# Patient Record
Sex: Male | Born: 1985
Health system: Southern US, Community
[De-identification: ages and names within clinical notes are randomized; demographics above are authoritative.]

---
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
## 2005-05-20 ENCOUNTER — Ambulatory Visit (HOSPITAL_COMMUNITY): Admission: RE | Admit: 2005-05-20 | Discharge: 2005-05-20 | Payer: Self-pay | Admitting: Orthopaedic Surgery

## 2006-04-04 ENCOUNTER — Emergency Department (HOSPITAL_COMMUNITY): Admission: EM | Admit: 2006-04-04 | Discharge: 2006-04-05 | Payer: Self-pay | Admitting: Emergency Medicine

## 2017-04-05 DIAGNOSIS — T753XXA Motion sickness, initial encounter: Secondary | ICD-10-CM | POA: Diagnosis not present

## 2017-04-11 MED FILL — TRANSDERM-SCOP 1.5 MG/3 DAY: 1 | 18 days supply | Qty: 6 | Fill #0

## 2018-04-03 DIAGNOSIS — K591 Functional diarrhea: Secondary | ICD-10-CM | POA: Diagnosis not present

## 2018-04-03 DIAGNOSIS — R1110 Vomiting, unspecified: Secondary | ICD-10-CM | POA: Diagnosis not present

## 2018-04-06 DIAGNOSIS — Z0000 Encounter for general adult medical examination without abnormal findings: Secondary | ICD-10-CM | POA: Diagnosis not present

## 2018-06-06 ENCOUNTER — Other Ambulatory Visit (HOSPITAL_COMMUNITY): Payer: Self-pay | Admitting: Family Medicine

## 2018-06-06 ENCOUNTER — Ambulatory Visit (HOSPITAL_COMMUNITY)
Admission: RE | Admit: 2018-06-06 | Discharge: 2018-06-06 | Disposition: A | Discharge status: Home / Self Care | Payer: 59 | Source: Ambulatory Visit | Attending: Family Medicine | Admitting: Family Medicine

## 2018-06-06 DIAGNOSIS — R918 Other nonspecific abnormal finding of lung field: Secondary | ICD-10-CM | POA: Diagnosis not present

## 2018-06-06 DIAGNOSIS — J0100 Acute maxillary sinusitis, unspecified: Secondary | ICD-10-CM | POA: Diagnosis not present

## 2018-06-06 DIAGNOSIS — R0781 Pleurodynia: Secondary | ICD-10-CM | POA: Insufficient documentation

## 2018-06-06 MED FILL — DOXYCYCLINE HYCLATE 100 MG: 100 | 14 days supply | Qty: 28 | Fill #0

## 2018-06-06 MED FILL — predniSONE 10 MG TABS: 10 | 12 days supply | Qty: 42 | Fill #0

## 2018-06-26 DIAGNOSIS — L739 Follicular disorder, unspecified: Secondary | ICD-10-CM | POA: Diagnosis not present

## 2018-10-02 DIAGNOSIS — J069 Acute upper respiratory infection, unspecified: Secondary | ICD-10-CM | POA: Diagnosis not present

## 2018-10-02 DIAGNOSIS — J029 Acute pharyngitis, unspecified: Secondary | ICD-10-CM | POA: Diagnosis not present

## 2019-05-28 DIAGNOSIS — R430 Anosmia: Secondary | ICD-10-CM | POA: Diagnosis not present

## 2019-05-28 DIAGNOSIS — J309 Allergic rhinitis, unspecified: Secondary | ICD-10-CM | POA: Diagnosis not present

## 2019-05-28 DIAGNOSIS — R197 Diarrhea, unspecified: Secondary | ICD-10-CM | POA: Diagnosis not present

## 2019-05-28 DIAGNOSIS — R05 Cough: Secondary | ICD-10-CM | POA: Diagnosis not present

## 2019-05-28 DIAGNOSIS — Z20828 Contact with and (suspected) exposure to other viral communicable diseases: Secondary | ICD-10-CM | POA: Diagnosis not present

## 2019-06-26 DIAGNOSIS — M25531 Pain in right wrist: Secondary | ICD-10-CM | POA: Diagnosis not present

## 2019-07-06 ENCOUNTER — Other Ambulatory Visit: Payer: Self-pay

## 2019-07-06 ENCOUNTER — Ambulatory Visit (INDEPENDENT_AMBULATORY_CARE_PROVIDER_SITE_OTHER): Payer: 59 | Admitting: Orthopaedic Surgery

## 2019-07-06 ENCOUNTER — Ambulatory Visit (INDEPENDENT_AMBULATORY_CARE_PROVIDER_SITE_OTHER): Payer: 59

## 2019-07-06 DIAGNOSIS — M25531 Pain in right wrist: Secondary | ICD-10-CM

## 2019-07-06 NOTE — Progress Notes (Signed)
Office Visit Note   Patient: Howard James           Date of Birth: 1986-08-10           MRN: [id number] Visit Date: 07/06/2019              Requested by: No referring provider defined for this encounter. PCP: Enid Skeens., MD   Assessment & Plan: Visit Diagnoses:  1. Pain in right wrist     Plan: Impression is right wrist pain questionable occult SL ligament injury.  I feel that his symptoms are more severe than a typical ganglion cyst therefore we will need to obtain an MR arthrogram of the right wrist to fully evaluate for structural abnormalities.    Follow-Up Instructions: Return in about 2 weeks (around 07/20/2019) for for MRI wrist review.   Orders:  Orders Placed This Encounter  Procedures  . XR Wrist Complete Right   No orders of the defined types were placed in this encounter.     Procedures: No procedures performed   Clinical Data: No additional findings.   Subjective: Chief Complaint  Patient presents with  . Right Wrist - Pain    Howard James is a very pleasant 33 year old right-hand-dominant healthy gentleman who comes in with for evaluation of right wrist pain for the last 6 to 12 months with a possible occult injury.  He had a left dorsal wrist ganglion cyst removed when he was 18.  He states that in the last 2 months his right wrist has gotten more symptomatic especially when he is active and using his wrist.  Wrist extension causes significant pain.  He feels like there may be a cyst in his right wrist.  Denies any numbness and tingling.   Review of Systems  Constitutional: Negative.   All other systems reviewed and are negative.    Objective: Vital Signs: There were no vitals taken for this visit.  Physical Exam Vitals signs and nursing note reviewed.  Constitutional:      Appearance: He is well-developed.  HENT:     Head: Normocephalic and atraumatic.  Eyes:     Pupils: Pupils are equal, round, and reactive to light.  Neck:   Musculoskeletal: Neck supple.  Pulmonary:     Effort: Pulmonary effort is normal.  Abdominal:     Palpations: Abdomen is soft.  Musculoskeletal: Normal range of motion.  Skin:    General: Skin is warm.  Neurological:     Mental Status: He is alert and oriented to person, place, and time.  Psychiatric:        Behavior: Behavior normal.        Thought Content: Thought content normal.        Judgment: Judgment normal.     Ortho Exam Right wrist exam shows significant tenderness in the SL interval.  TFCC is nontender.  Radial styloid is nontender. Specialty Comments:  No specialty comments available.  Imaging: Xr Wrist Complete Right  Result Date: 07/06/2019 No acute or structural abnormalities    PMFS History: There are no active problems to display for this patient.  No past medical history on file.  No family history on file.   Social History   Occupational History  . Not on file  Tobacco Use  . Smoking status: Not on file  Substance and Sexual Activity  . Alcohol use: Not on file  . Drug use: Not on file  . Sexual activity: Not on file

## 2019-07-20 ENCOUNTER — Ambulatory Visit: Payer: 59 | Admitting: Orthopaedic Surgery

## 2019-07-24 DIAGNOSIS — R21 Rash and other nonspecific skin eruption: Secondary | ICD-10-CM | POA: Diagnosis not present

## 2019-10-22 IMAGING — CR DG CHEST 2V
2 series · 2 of 2 positions shown · non-contrast
Comparison: None.

CLINICAL DATA: Recent sinus infection. Patient reports pleuritic
chest discomfort with the breathing which is becoming more frequent.

EXAM:
CHEST - 2 VIEW

[w chest pa]
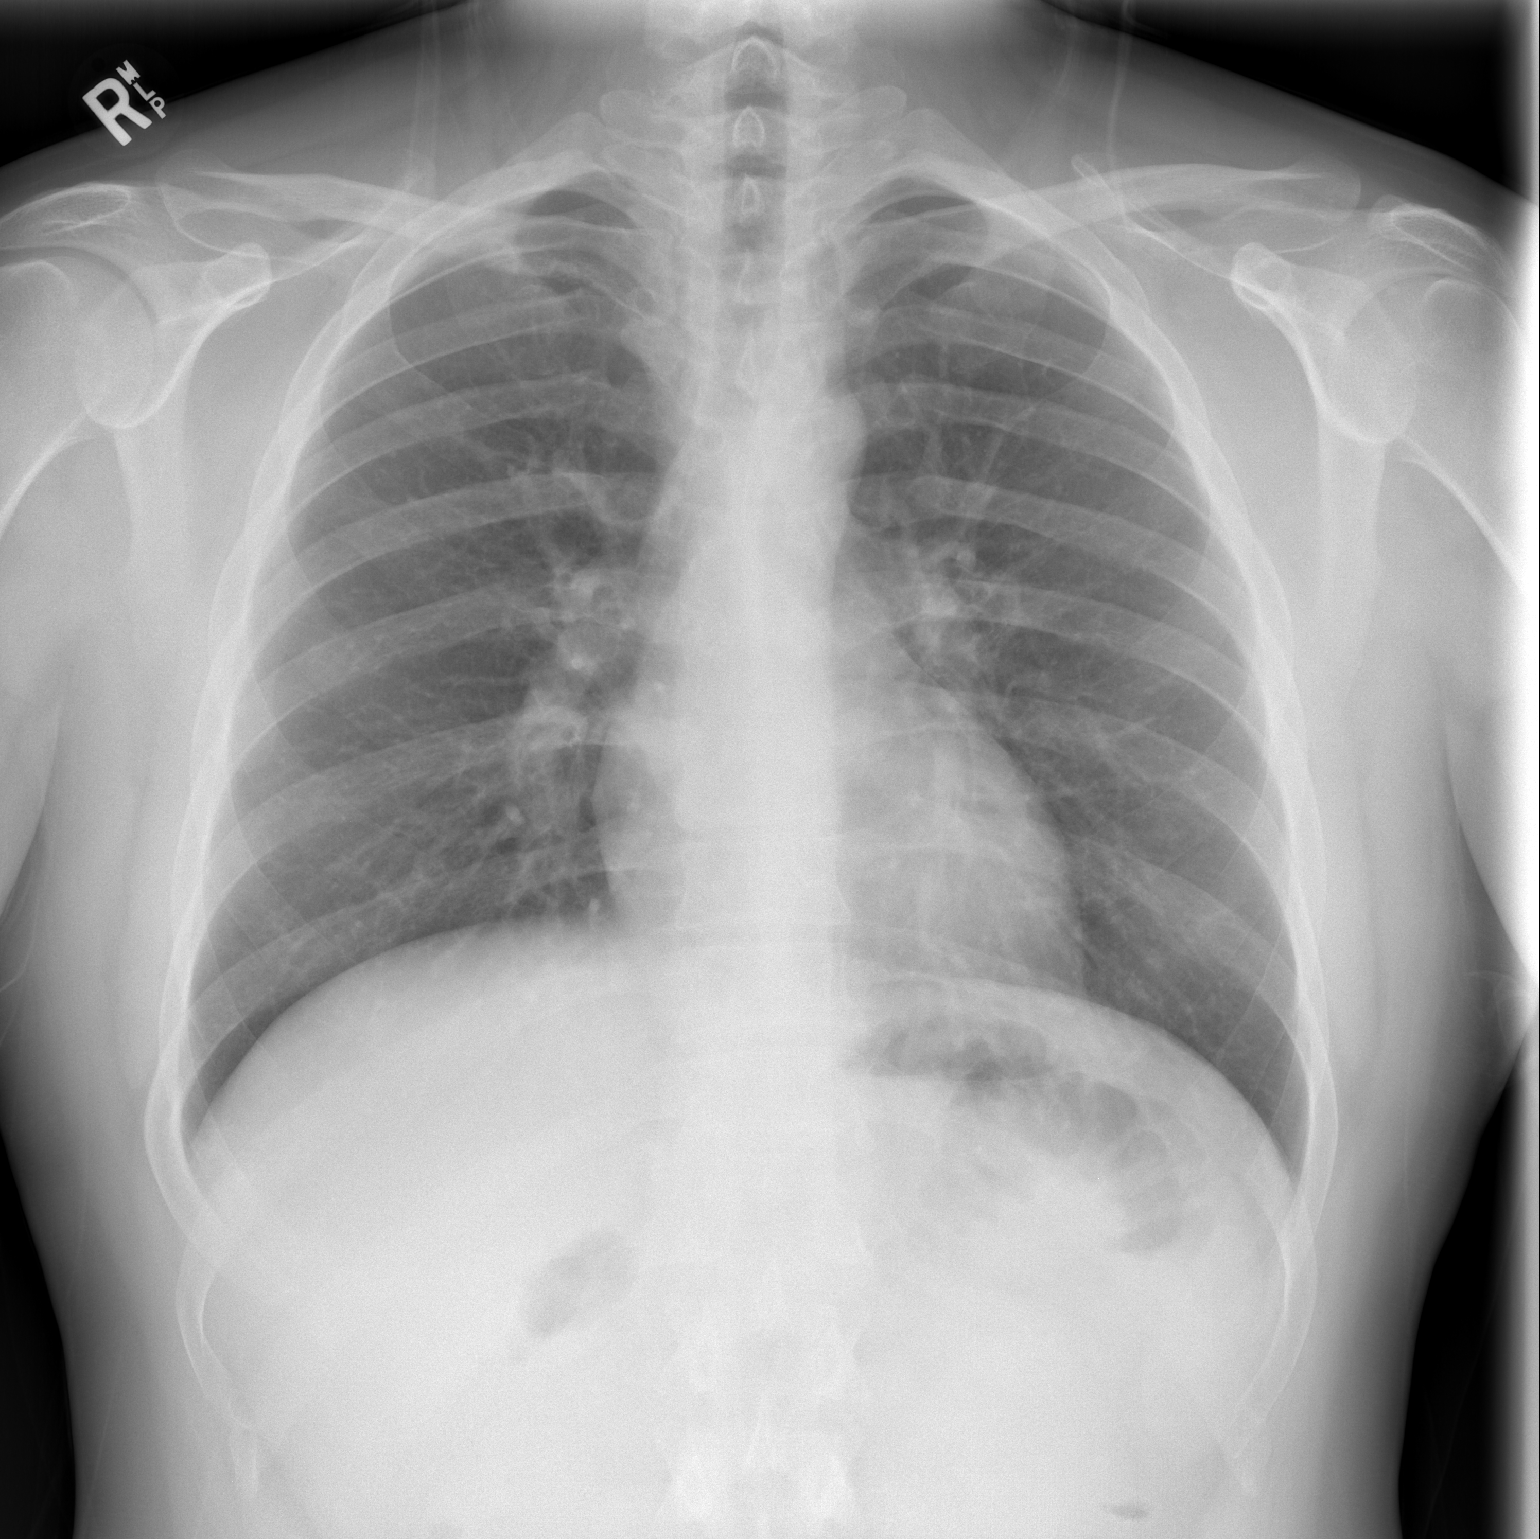

[w chest lat]
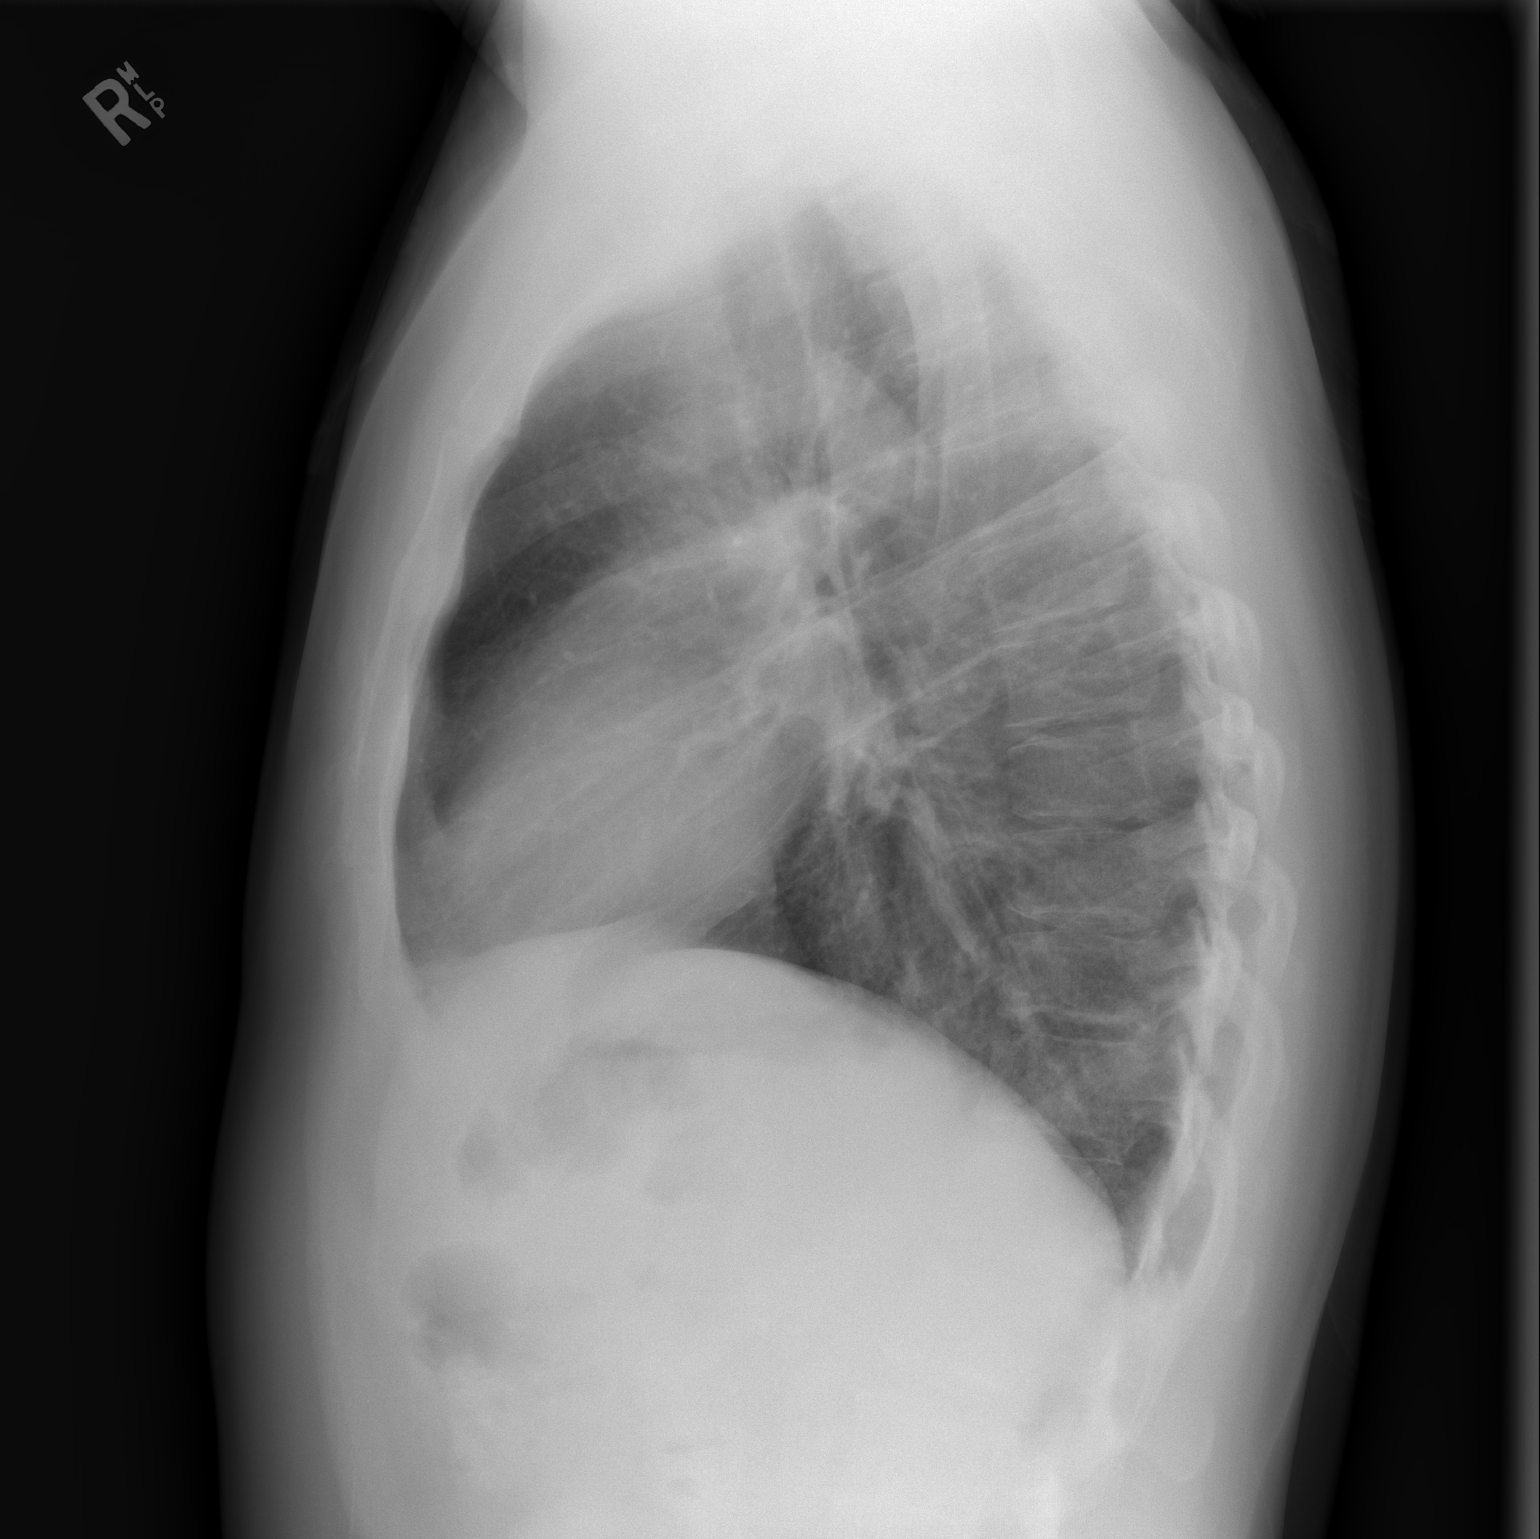

[2 of 2 positions shown; findings below may reference images not displayed]

FINDINGS: The lungs are adequately inflated. The lung markings are coarse in
the left lower lobe posteriorly. There is no pleural effusion or
pneumothorax. The heart and pulmonary vascularity are normal. The
mediastinum is normal in width. The bony thorax exhibits no acute
abnormality.
IMPRESSION: Left lower lobe subsegmental atelectasis or developing infiltrate.
Follow-up radiographs following anticipated antibiotic therapy are
recommended if the patient's symptoms persist.

## 2020-12-26 DIAGNOSIS — Z23 Encounter for immunization: Secondary | ICD-10-CM | POA: Diagnosis not present

## 2021-06-04 ENCOUNTER — Other Ambulatory Visit (HOSPITAL_COMMUNITY): Payer: Self-pay

## 2021-06-04 DIAGNOSIS — W57XXXA Bitten or stung by nonvenomous insect and other nonvenomous arthropods, initial encounter: Secondary | ICD-10-CM | POA: Diagnosis not present

## 2021-06-04 DIAGNOSIS — L301 Dyshidrosis [pompholyx]: Secondary | ICD-10-CM | POA: Diagnosis not present

## 2021-06-04 DIAGNOSIS — S80861A Insect bite (nonvenomous), right lower leg, initial encounter: Secondary | ICD-10-CM | POA: Diagnosis not present

## 2021-06-04 MED ORDER — CLOBETASOL PROPIONATE 0.05 % EX OINT
TOPICAL_OINTMENT | CUTANEOUS | 0 refills | Status: AC
  Filled 2021-06-04: qty 15, 3d supply, fill #0
  Filled 2021-06-04: qty 30, 7d supply, fill #0

## 2021-06-18 DIAGNOSIS — L814 Other melanin hyperpigmentation: Secondary | ICD-10-CM | POA: Diagnosis not present

## 2021-06-18 DIAGNOSIS — D225 Melanocytic nevi of trunk: Secondary | ICD-10-CM | POA: Diagnosis not present

## 2021-06-18 DIAGNOSIS — D1801 Hemangioma of skin and subcutaneous tissue: Secondary | ICD-10-CM | POA: Diagnosis not present

## 2021-06-18 DIAGNOSIS — D2271 Melanocytic nevi of right lower limb, including hip: Secondary | ICD-10-CM | POA: Diagnosis not present

## 2021-06-18 DIAGNOSIS — L578 Other skin changes due to chronic exposure to nonionizing radiation: Secondary | ICD-10-CM | POA: Diagnosis not present

## 2021-07-02 ENCOUNTER — Other Ambulatory Visit (HOSPITAL_COMMUNITY): Payer: Self-pay

## 2021-07-10 ENCOUNTER — Other Ambulatory Visit (HOSPITAL_COMMUNITY): Payer: Self-pay

## 2021-08-05 DIAGNOSIS — Z8279 Family history of other congenital malformations, deformations and chromosomal abnormalities: Secondary | ICD-10-CM | POA: Diagnosis not present

## 2021-09-02 DIAGNOSIS — J189 Pneumonia, unspecified organism: Secondary | ICD-10-CM | POA: Diagnosis not present

## 2021-09-02 DIAGNOSIS — Z20828 Contact with and (suspected) exposure to other viral communicable diseases: Secondary | ICD-10-CM | POA: Diagnosis not present

## 2021-09-02 DIAGNOSIS — R062 Wheezing: Secondary | ICD-10-CM | POA: Diagnosis not present

## 2022-01-15 ENCOUNTER — Telehealth: Payer: No Typology Code available for payment source | Admitting: Physician Assistant

## 2022-01-15 DIAGNOSIS — H1030 Unspecified acute conjunctivitis, unspecified eye: Secondary | ICD-10-CM | POA: Diagnosis not present

## 2022-01-15 MED ORDER — OFLOXACIN 0.3 % OP SOLN
1.0000 [drp] | Freq: Four times a day (QID) | OPHTHALMIC | 0 refills | Status: AC
  Filled 2022-01-15: qty 5, 25d supply, fill #0
  Filled 2022-01-19: qty 5, 10d supply, fill #0

## 2022-01-15 NOTE — Progress Notes (Signed)
E-Visit for Pink Eye ? ? ?We are sorry that you are not feeling well.  Here is how we plan to help! ? ?Based on what you have shared with me it looks like you have conjunctivitis.  Conjunctivitis is a common inflammatory or infectious condition of the eye that is often referred to as "pink eye".  In most cases it is contagious (viral or bacterial). However, not all conjunctivitis requires antibiotics (ex. Allergic).  We have made appropriate suggestions for you based upon your presentation. ? ?I have prescribed Oflaxacin 1-2 drops 4 times a day times 5 days  ? ?If you are having vision changes you need to be seen for an in person visit. ? ?Pink eye can be highly contagious.  It is typically spread through direct contact with secretions, or contaminated objects or surfaces that one may have touched.  Strict handwashing is suggested with soap and water is urged.  If not available, use alcohol based had sanitizer.  Avoid unnecessary touching of the eye.  If you wear contact lenses, you will need to refrain from wearing them until you see no white discharge from the eye for at least 24 hours after being on medication.  You should see symptom improvement in 1-2 days after starting the medication regimen.  Call us if symptoms are not improved in 1-2 days. ? ?Home Care: ?Wash your hands often! ?Do not wear your contacts until you complete your treatment plan. ?Avoid sharing towels, bed linen, personal items with a person who has pink eye. ?See attention for anyone in your home with similar symptoms. ? ?Get Help Right Away If: ?Your symptoms do not improve. ?You develop blurred or loss of vision. ?Your symptoms worsen (increased discharge, pain or redness) ? ? ?Thank you for choosing an e-visit. ? ?Your e-visit answers were reviewed by a board certified advanced clinical practitioner to complete your personal care plan. Depending upon the condition, your plan could have included both over the counter or prescription  medications. ? ?Please review your pharmacy choice. Make sure the pharmacy is open so you can pick up prescription now. If there is a problem, you may contact your provider through [employer] and have the prescription routed to another pharmacy.  Your safety is important to [place]. If you have drug allergies check your prescription carefully.  ? ?For the next 24 hours you can use MyChart to ask questions about today's visit, request a non-urgent call back, or ask for a work or school excuse. ?You will get an email in the next two days asking about your experience. I hope that your e-visit has been valuable and will speed your recovery. ? ?Approximately 5 minutes was spent documenting and reviewing patient's chart. ? ?

## 2022-01-16 ENCOUNTER — Other Ambulatory Visit (HOSPITAL_COMMUNITY): Payer: Self-pay

## 2022-01-16 ENCOUNTER — Encounter (HOSPITAL_COMMUNITY): Payer: Self-pay

## 2022-01-19 ENCOUNTER — Telehealth: Payer: No Typology Code available for payment source | Admitting: Physician Assistant

## 2022-01-19 ENCOUNTER — Other Ambulatory Visit (HOSPITAL_COMMUNITY): Payer: Self-pay

## 2022-01-19 DIAGNOSIS — J069 Acute upper respiratory infection, unspecified: Secondary | ICD-10-CM

## 2022-01-19 MED ORDER — IPRATROPIUM BROMIDE 0.03 % NA SOLN
2.0000 | Freq: Two times a day (BID) | NASAL | 0 refills | Status: AC
  Filled 2022-01-19: qty 30, 43d supply, fill #0

## 2022-01-19 MED ORDER — BENZONATATE 100 MG PO CAPS
100.0000 mg | ORAL_CAPSULE | Freq: Three times a day (TID) | ORAL | 0 refills | Status: AC | PRN
  Filled 2022-01-19: qty 30, 10d supply, fill #0

## 2022-01-19 NOTE — Progress Notes (Signed)

## 2022-10-17 ENCOUNTER — Telehealth: Payer: No Typology Code available for payment source | Admitting: Family

## 2022-10-17 DIAGNOSIS — J069 Acute upper respiratory infection, unspecified: Secondary | ICD-10-CM | POA: Diagnosis not present

## 2022-10-17 MED ORDER — FLUTICASONE PROPIONATE 50 MCG/ACT NA SUSP
2.0000 | Freq: Every day | NASAL | 6 refills | Status: AC
  Filled 2022-10-17: qty 16, 30d supply, fill #0

## 2022-10-17 NOTE — Progress Notes (Signed)

## 2022-10-18 ENCOUNTER — Other Ambulatory Visit: Payer: Self-pay

## 2022-10-18 ENCOUNTER — Other Ambulatory Visit (HOSPITAL_COMMUNITY): Payer: Self-pay

## 2022-10-26 ENCOUNTER — Other Ambulatory Visit (HOSPITAL_COMMUNITY): Payer: Self-pay
